# Patient Record
Sex: Male | Born: 1955 | Race: White | Hispanic: No | Smoking: Never smoker
Health system: Southern US, Community
[De-identification: ages and names within clinical notes are randomized; demographics above are authoritative.]

## PROBLEM LIST (undated history)

## (undated) DIAGNOSIS — S3992XA Unspecified injury of lower back, initial encounter: Secondary | ICD-10-CM

## (undated) DIAGNOSIS — N200 Calculus of kidney: Secondary | ICD-10-CM

## (undated) DIAGNOSIS — K5792 Diverticulitis of intestine, part unspecified, without perforation or abscess without bleeding: Secondary | ICD-10-CM

## (undated) DIAGNOSIS — I10 Essential (primary) hypertension: Secondary | ICD-10-CM

## (undated) HISTORY — DX: Calculus of kidney: N20.0

## (undated) HISTORY — DX: Unspecified injury of lower back, initial encounter: S39.92XA

---

## 1985-05-26 HISTORY — PX: WRIST SURGERY: SHX841

## 2015-09-10 ENCOUNTER — Emergency Department: Payer: BLUE CROSS/BLUE SHIELD

## 2015-09-10 ENCOUNTER — Encounter: Payer: Self-pay | Admitting: Emergency Medicine

## 2015-09-10 ENCOUNTER — Emergency Department
Admission: EM | Admit: 2015-09-10 | Discharge: 2015-09-10 | Disposition: A | Discharge status: Home / Self Care | Payer: BLUE CROSS/BLUE SHIELD | Attending: Student | Admitting: Student

## 2015-09-10 DIAGNOSIS — R109 Unspecified abdominal pain: Secondary | ICD-10-CM | POA: Diagnosis present

## 2015-09-10 DIAGNOSIS — N201 Calculus of ureter: Secondary | ICD-10-CM | POA: Diagnosis not present

## 2015-09-10 DIAGNOSIS — I10 Essential (primary) hypertension: Secondary | ICD-10-CM | POA: Diagnosis not present

## 2015-09-10 HISTORY — DX: Diverticulitis of intestine, part unspecified, without perforation or abscess without bleeding: K57.92

## 2015-09-10 HISTORY — DX: Essential (primary) hypertension: I10

## 2015-09-10 LAB — CBC WITH DIFFERENTIAL/PLATELET
BASOS ABS: 0 10*3/uL (ref 0–0.1)
Basophils Relative: 0 %
EOS PCT: 0 %
Eosinophils Absolute: 0 10*3/uL (ref 0–0.7)
HCT: 42.8 % (ref 40.0–52.0)
Hemoglobin: 15 g/dL (ref 13.0–18.0)
LYMPHS PCT: 9 %
Lymphs Abs: 1 10*3/uL (ref 1.0–3.6)
MCH: 32.1 pg (ref 26.0–34.0)
MCHC: 35 g/dL (ref 32.0–36.0)
MCV: 91.6 fL (ref 80.0–100.0)
Monocytes Absolute: 0.6 10*3/uL (ref 0.2–1.0)
Monocytes Relative: 5 %
NEUTROS ABS: 9.3 10*3/uL — AB (ref 1.4–6.5)
NEUTROS PCT: 86 %
PLATELETS: 214 10*3/uL (ref 150–440)
RBC: 4.68 MIL/uL (ref 4.40–5.90)
RDW: 13.2 % (ref 11.5–14.5)
WBC: 10.9 10*3/uL — AB (ref 3.8–10.6)

## 2015-09-10 LAB — URINALYSIS COMPLETE WITH MICROSCOPIC (ARMC ONLY)
BACTERIA UA: NONE SEEN
Bilirubin Urine: NEGATIVE
Glucose, UA: 50 mg/dL — AB
Nitrite: NEGATIVE
PROTEIN: NEGATIVE mg/dL
Specific Gravity, Urine: 1.02 (ref 1.005–1.030)
pH: 5 (ref 5.0–8.0)

## 2015-09-10 LAB — LIPASE, BLOOD: LIPASE: 25 U/L (ref 11–51)

## 2015-09-10 LAB — COMPREHENSIVE METABOLIC PANEL
ALT: 29 U/L (ref 17–63)
AST: 34 U/L (ref 15–41)
Albumin: 4.1 g/dL (ref 3.5–5.0)
Alkaline Phosphatase: 60 U/L (ref 38–126)
Anion gap: 6 (ref 5–15)
BUN: 24 mg/dL — AB (ref 6–20)
CHLORIDE: 108 mmol/L (ref 101–111)
CO2: 21 mmol/L — AB (ref 22–32)
CREATININE: 0.99 mg/dL (ref 0.61–1.24)
Calcium: 8.7 mg/dL — ABNORMAL LOW (ref 8.9–10.3)
GFR calc Af Amer: >60 mL/min (ref >60)
Glucose, Bld: 115 mg/dL — ABNORMAL HIGH (ref 65–99)
Potassium: 4.2 mmol/L (ref 3.5–5.1)
Sodium: 135 mmol/L (ref 135–145)
Total Bilirubin: 0.8 mg/dL (ref 0.3–1.2)
Total Protein: 7.5 g/dL (ref 6.5–8.1)

## 2015-09-10 MED ORDER — OXYCODONE HCL 5 MG PO TABS: 5.0000 mg | ORAL_TABLET | Freq: Four times a day (QID) | ORAL | Status: AC | PRN

## 2015-09-10 MED ORDER — SODIUM CHLORIDE 0.9 % IV BOLUS (SEPSIS)
1000.0000 mL | Freq: Once | INTRAVENOUS | Status: AC
  Administered 2015-09-10: 1000 mL via INTRAVENOUS

## 2015-09-10 MED ORDER — HYDROMORPHONE HCL 1 MG/ML IJ SOLN
1.0000 mg | Freq: Once | INTRAMUSCULAR | Status: AC
  Administered 2015-09-10: 1 mg via INTRAVENOUS
  Filled 2015-09-10: qty 1

## 2015-09-10 MED ORDER — KETOROLAC TROMETHAMINE 30 MG/ML IJ SOLN
15.0000 mg | Freq: Once | INTRAMUSCULAR | Status: AC
  Administered 2015-09-10: 15 mg via INTRAVENOUS
  Filled 2015-09-10: qty 1

## 2015-09-10 MED ORDER — ONDANSETRON HCL 4 MG/2ML IJ SOLN
INTRAMUSCULAR | Status: AC
  Filled 2015-09-10: qty 2

## 2015-09-10 MED ORDER — MORPHINE SULFATE (PF) 4 MG/ML IV SOLN
4.0000 mg | Freq: Once | INTRAVENOUS | Status: AC
  Administered 2015-09-10: 4 mg via INTRAVENOUS

## 2015-09-10 MED ORDER — ONDANSETRON HCL 4 MG/2ML IJ SOLN
4.0000 mg | Freq: Once | INTRAMUSCULAR | Status: AC
  Administered 2015-09-10: 4 mg via INTRAVENOUS

## 2015-09-10 MED ORDER — ONDANSETRON 4 MG PO TBDP: 4.0000 mg | ORAL_TABLET | Freq: Three times a day (TID) | ORAL | Status: AC | PRN

## 2015-09-10 MED ORDER — TAMSULOSIN HCL 0.4 MG PO CAPS: 0.4000 mg | ORAL_CAPSULE | Freq: Every day | ORAL | Status: AC

## 2015-09-10 MED ORDER — ONDANSETRON HCL 4 MG/2ML IJ SOLN
4.0000 mg | Freq: Once | INTRAMUSCULAR | Status: AC
  Administered 2015-09-10: 4 mg via INTRAVENOUS
  Filled 2015-09-10: qty 2

## 2015-09-10 MED ORDER — MORPHINE SULFATE (PF) 4 MG/ML IV SOLN
INTRAVENOUS | Status: AC
  Filled 2015-09-10: qty 1

## 2015-09-10 NOTE — ED Notes (Signed)
Patient transported to CT 

## 2015-09-10 NOTE — ED Notes (Deleted)
Pt with left flank pain 10/10. Denies changes with urination. Possibly had previous stone. Does have chronic back pain.

## 2015-09-10 NOTE — ED Notes (Signed)
Pt with left flank pain that started today. Possible prior stone. Denies changes or pain with urination. Does have chronic back pain.

## 2015-09-10 NOTE — ED Provider Notes (Signed)
[facility] Emergency Department Provider Note  ____________________________________________  Time seen: Approximately 10:02 AM  I have reviewed the triage vital signs and the nursing notes.   HISTORY  Chief Complaint Flank Pain    HPI Jonathan Lynch is a 60 y.o. male history of hypertension and diverticulitis presents for evaluation of several hours of sudden onset left flank pain, constant since onset, waxing and waning in nature, currently severe, associated with nausea. No diarrhea, no fevers or chills. No chest pain or difficulty breathing. Feels similar to when he had a kidney stone in the remote past. Denies dysuria or hematuria.   Past Medical History  Diagnosis Date  . Hypertension   . Diverticulitis     There are no active problems to display for this patient.   Past Surgical History  Procedure Laterality Date  . Back surgery      No current outpatient prescriptions on file.  Allergies Review of patient's allergies indicates no known allergies.  History reviewed. No pertinent family history.  Social History Social History  Substance Use Topics  . Smoking status: Never Smoker   . Smokeless tobacco: None  . Alcohol Use: No    Review of Systems Constitutional: No fever/chills Eyes: No visual changes. ENT: No sore throat. Cardiovascular: Denies chest pain. Respiratory: Denies shortness of breath. Gastrointestinal: No abdominal pain.  + nausea, no vomiting.  No diarrhea.  No constipation. Genitourinary: Negative for dysuria. Musculoskeletal: Positive for flank pain. Skin: Negative for rash. Neurological: Negative for headaches, focal weakness or numbness.  10-point ROS otherwise negative.  ____________________________________________   PHYSICAL EXAM:  VITAL SIGNS: ED Triage Vitals  Enc Vitals Group     BP 09/10/15 0842 146/93 mmHg     Pulse Rate 09/10/15 0842 63     Resp 09/10/15 0842 18     Temp 09/10/15  0842 97.8 F (36.6 C)     Temp Source 09/10/15 0842 Oral     SpO2 09/10/15 0842 99 %     Weight 09/10/15 0842 225 lb (102.059 kg)     Height 09/10/15 0842 5\' 11"  (1.803 m)     Head Cir --      Peak Flow --      Pain Score 09/10/15 0842 4     Pain Loc --      Pain Edu? --      Excl. in GC? --     Constitutional: Alert and oriented. In distress secondary to pain. Eyes: Conjunctivae are normal. PERRL. EOMI. Head: Atraumatic. Nose: No congestion/rhinnorhea. Mouth/Throat: Mucous membranes are moist.  Oropharynx non-erythematous. Neck: No stridor.  No cervical spine tenderness to palpation. Cardiovascular: Normal rate, regular rhythm. Grossly normal heart sounds.  Good peripheral circulation. Respiratory: Normal respiratory effort.  No retractions. Lungs CTAB. Gastrointestinal: Soft and nontender. No distention.  No CVA tenderness. Genitourinary: deferred Musculoskeletal: No lower extremity tenderness nor edema.  No joint effusions. Neurologic:  Normal speech and language. No gross focal neurologic deficits are appreciated. No gait instability. Skin:  Skin is warm, dry and intact. No rash noted. Psychiatric: Mood and affect are normal. Speech and behavior are normal.  ____________________________________________   LABS (all labs ordered are listed, but only abnormal results are displayed)  Labs Reviewed  URINALYSIS COMPLETEWITH MICROSCOPIC ([facility] ONLY) - Abnormal; Notable for the following:    Color, Urine YELLOW (*)    APPearance CLEAR (*)    Glucose, UA 50 (*)    Ketones, ur TRACE (*)    Hgb  urine dipstick 3+ (*)    Leukocytes, UA TRACE (*)    Squamous Epithelial / LPF 0-5 (*)    All other components within normal limits  CBC WITH DIFFERENTIAL/PLATELET - Abnormal; Notable for the following:    WBC 10.9 (*)    Neutro Abs 9.3 (*)    All other components within normal limits  COMPREHENSIVE METABOLIC PANEL - Abnormal; Notable for the following:    CO2 21 (*)    Glucose, Bld  115 (*)    BUN 24 (*)    Calcium 8.7 (*)    All other components within normal limits  LIPASE, BLOOD   ____________________________________________  EKG  none ____________________________________________  RADIOLOGY  CT abdomen and pelvis IMPRESSION: 1. 3 mm distal left ureteral calculus with mild left hydronephrosis and mild left perinephric stranding. ____________________________________________   PROCEDURES  Procedure(s) performed: None  Critical Care performed: No  ____________________________________________   INITIAL IMPRESSION / ASSESSMENT AND PLAN / ED COURSE  Pertinent labs & imaging results that were available during my care of the patient were reviewed by me and considered in my medical decision making (see chart for details).  Jonathan Lynch is a 60 y.o. male history of hypertension and diverticulitis presents for evaluation of several hours of sudden onset left flank pain. On exam, he is in distress secondary to pain. Suspect kidney stone, we'll treat his pain, obtain screening labs, urinalysis, likely CT of the abdomen and pelvis.  ----------------------------------------- 1:52 PM on 09/10/2015 ----------------------------------------- Patient with significant improvement of his pain at this time. He reports he feels much better and appears comfortable. He ambulates well. He is tolerating by mouth intake without vomiting. CT scan shows 3 mm distal left ureteral stone with mild associated hydronephrosis. Urinalysis with minimal white blood cells, nitrite negative, not consistent with infection. CBC with mild leukocytosis. CMP with normal creatinine. Normal lipase. Discussed return precautions, expectant management, need for close urology follow-up and he is comfortable with the discharge plan. DC home. ____________________________________________   FINAL CLINICAL IMPRESSION(S) / ED DIAGNOSES  Final diagnoses:  Ureterolithiasis  Flank pain, acute       Gayla Doss, MD 09/10/15 1353

## 2015-09-24 ENCOUNTER — Ambulatory Visit (INDEPENDENT_AMBULATORY_CARE_PROVIDER_SITE_OTHER): Payer: BLUE CROSS/BLUE SHIELD | Admitting: Urology

## 2015-09-24 ENCOUNTER — Encounter: Payer: Self-pay | Admitting: Urology

## 2015-09-24 VITALS — BP 119/74 | HR 83 | Ht 71.0 in | Wt 227.5 lb

## 2015-09-24 DIAGNOSIS — R3129 Other microscopic hematuria: Secondary | ICD-10-CM

## 2015-09-24 DIAGNOSIS — N201 Calculus of ureter: Secondary | ICD-10-CM

## 2015-09-24 DIAGNOSIS — N132 Hydronephrosis with renal and ureteral calculous obstruction: Secondary | ICD-10-CM

## 2015-09-24 LAB — URINALYSIS, COMPLETE
Bilirubin, UA: NEGATIVE
GLUCOSE, UA: NEGATIVE
KETONES UA: NEGATIVE
Nitrite, UA: NEGATIVE
Protein, UA: NEGATIVE
RBC, UA: NEGATIVE
SPEC GRAV UA: 1.025 (ref 1.005–1.030)
Urobilinogen, Ur: 0.2 mg/dL (ref 0.2–1.0)
pH, UA: 5 (ref 5.0–7.5)

## 2015-09-24 LAB — MICROSCOPIC EXAMINATION
BACTERIA UA: NONE SEEN
Epithelial Cells (non renal): NONE SEEN /hpf (ref 0–10)

## 2015-09-24 NOTE — Progress Notes (Signed)
09/24/2015 3:52 PM   Jonathan Lynch 07-02-1955 [id number]  Referring provider: Delton Prairie, MD [address] [facility] PRIMARY CARE [place], [place] [address]  Chief Complaint  Patient presents with  . Nephrolithiasis    ER referral    HPI: Patient is a 60 year old Caucasian male who is referred to [place] by [facility] ED for nephrolithiasis.  Patient states that about 3 weeks ago he had the sudden onset of left-sided flank pain. Prior to this, the pain initiating he had a dull ache down the left lower quadrant for about a week.  He describes the pain was colicky in nature. It became so intense that he sought treatment in the emergency room.   He did not have fever or chills associated with the pain, but he did have nausea and dry heaves. He denied any gross hematuria.  He believes he may have passed a stone spontaneously approximately 15 years ago.  In the emergency room, CT renal stone study conducted in 09/10/2015 noted a 3 mm distal left ureteral calculus with mild left hydronephrosis and mild left perinephric stranding.  UA at that time noted to numerous to count RBCs per high-power field.  Serum creatinine of 0.99 mg/dL.    He was discharged with analgesics, antiemetics and tamsulosin.   He was instructed to follow up with urology.    Today, he is not experiencing symptoms. He has since felt like he has passed stone. His UA today was unremarkable.  PMH: Past Medical History  Diagnosis Date  . Hypertension   . Diverticulitis   . Kidney stones   . Back injuries     fractured 5 vertebrae no surgery    Surgical History: Past Surgical History  Procedure Laterality Date  . Wrist surgery  1987    Home Medications:    Medication List       This list is accurate as of: 09/24/15  3:52 PM.  Always use your most recent med list.               FIBERCON PO  Take 625 mg by mouth.     lisinopril 5 MG tablet  Commonly known as:  PRINIVIL,ZESTRIL  Take 5 mg by mouth.     ondansetron 4 MG disintegrating tablet  Commonly known as:  ZOFRAN ODT  Take 1 tablet (4 mg total) by mouth every 8 (eight) hours as needed for nausea or vomiting.     oxyCODONE 5 MG immediate release tablet  Commonly known as:  ROXICODONE  Take 1 tablet (5 mg total) by mouth every 6 (six) hours as needed for moderate pain. Do not drive while taking this medication.     tamsulosin 0.4 MG Caps capsule  Commonly known as:  FLOMAX  Take 1 capsule (0.4 mg total) by mouth daily.     V-R IBUPROFEN JR 100 MG tablet  Generic drug:  ibuprofen  Take 100 mg by mouth.     Vitamin D3 2000 units capsule  Take by mouth.        Allergies: No Known Allergies  Family History: Family History  Problem Relation Age of Onset  . Kidney disease Neg Hx   . Prostate cancer Neg Hx     Social History:  reports that he has never smoked. He does not have any smokeless tobacco history on file. He reports that he does not drink alcohol or use illicit drugs.  ROS: UROLOGY Frequent Urination?: No Hard to postpone urination?: No Burning/pain with  urination?: No Get up at night to urinate?: No Leakage of urine?: No Urine stream starts and stops?: No Trouble starting stream?: No Do you have to strain to urinate?: No Blood in urine?: No Urinary tract infection?: No Sexually transmitted disease?: No Injury to kidneys or bladder?: No Painful intercourse?: No Weak stream?: No Erection problems?: No Penile pain?: No  Gastrointestinal Nausea?: No Vomiting?: No Indigestion/heartburn?: No Diarrhea?: No Constipation?: No  Constitutional Fever: No Night sweats?: No Weight loss?: No Fatigue?: No  Skin Skin rash/lesions?: No Itching?: No  Eyes Blurred vision?: No Double vision?: No  Ears/Nose/Throat Sore throat?: No Sinus problems?: No  Hematologic/Lymphatic Swollen glands?: No Easy bruising?: No  Cardiovascular Leg swelling?: No Chest pain?: No  Respiratory Cough?:  No Shortness of breath?: No  Endocrine Excessive thirst?: No  Musculoskeletal Back pain?: No Joint pain?: No  Neurological Headaches?: No Dizziness?: No  Psychologic Depression?: No Anxiety?: No  Physical Exam: BP 119/74 mmHg  Pulse 83  Ht  (1.803 m)  Wt 227 lb 8 oz (103.193 kg)  BMI 31.74 kg/m2  Constitutional: Well nourished. Alert and oriented, No acute distress. HEENT: [place] AT, moist mucus membranes. Trachea midline, no masses. Cardiovascular: No clubbing, cyanosis, or edema. Respiratory: Normal respiratory effort, no increased work of breathing. GI: Abdomen is soft, non tender, non distended, no abdominal masses. Liver and spleen not palpable.  No hernias appreciated.  Stool sample for occult testing is not indicated.   GU: No CVA tenderness.  No bladder fullness or masses.  Patient with circumcised phallus.   Urethral meatus is patent.  No penile discharge. No penile lesions or rashes. Scrotum without lesions, cysts, rashes and/or edema.  Testicles are located scrotally bilaterally. No masses are appreciated in the testicles. Left and right epididymis are normal.  Left varicocele is noted.  Rectal: Deferred.   Skin: No rashes, bruises or suspicious lesions. Lymph: No cervical or inguinal adenopathy. Neurologic: Grossly intact, no focal deficits, moving all 4 extremities. Psychiatric: Normal mood and affect.  Laboratory Data: Lab Results  Component Value Date   WBC 10.9* 09/10/2015   HGB 15.0 09/10/2015   HCT 42.8 09/10/2015   MCV 91.6 09/10/2015   PLT 214 09/10/2015    Lab Results  Component Value Date   CREATININE 0.99 09/10/2015    Lab Results  Component Value Date   AST 34 09/10/2015   Lab Results  Component Value Date   ALT 29 09/10/2015    Urinalysis Results for orders placed or performed in visit on 09/24/15  CULTURE, URINE COMPREHENSIVE  Result Value Ref Range   Urine Culture, Comprehensive Final report    Result 1 Comment    Microscopic Examination  Result Value Ref Range   WBC, UA 11-30 (A) 0 -  5 /hpf   RBC, UA 0-2 0 -  2 /hpf   Epithelial Cells (non renal) None seen 0 - 10 /hpf   Mucus, UA Present (A) Not Estab.   Bacteria, UA None seen None seen/Few  Urinalysis, Complete  Result Value Ref Range   Specific Gravity, UA 1.025 1.005 - 1.030   pH, UA 5.0 5.0 - 7.5   Color, UA Yellow Yellow   Appearance Ur Clear Clear   Leukocytes, UA Trace (A) Negative   Protein, UA Negative Negative/Trace   Glucose, UA Negative Negative   Ketones, UA Negative Negative   RBC, UA Negative Negative   Bilirubin, UA Negative Negative   Urobilinogen, Ur 0.2 0.2 - 1.0 mg/dL  Nitrite, UA Negative Negative   Microscopic Examination See below:     Pertinent Imaging: CLINICAL DATA: Left flank pain started today.  EXAM: CT ABDOMEN AND PELVIS WITHOUT CONTRAST  TECHNIQUE: Multidetector CT imaging of the abdomen and pelvis was performed following the standard protocol without IV contrast.  COMPARISON: None.  FINDINGS: Lower chest: Lung bases are clear. Normal heart size.  Hepatobiliary: Normal ule liver. Mildly distended gallbladder without other focal abnormality.  Pancreas: Normal.  Spleen: Normal.  Adrenals/Urinary Tract: Normal adrenal glands. Normal right kidney. 3 mm distal left ureteral calculus with mild left hydronephrosis. Mild left perinephric stranding. Normal bladder.  Stomach/Bowel: No bowel wall thickening or bowel dilatation. No pneumatosis, pneumoperitoneum or portal venous gas. Diverticulosis without evidence of diverticulitis.  Vascular/Lymphatic: Normal caliber abdominal aorta with atherosclerosis. No lymphadenopathy. Scratched  Other: No fluid collection or hematoma.  Musculoskeletal: No acute osseous abnormality. No lytic or sclerotic osseous lesion. Mild degenerative disc disease of the lower thoracic spine.  IMPRESSION: 1. 3 mm distal left ureteral calculus with  mild left hydronephrosis and mild left perinephric stranding.   Electronically Signed  By: Elige KoHetal Patel  On: 09/10/2015 11:18   Assessment & Plan:    1. Left ureteral calculus:   Patient has since felt like he passed the stone. We have sent for analysis. This may be his second episode of nephrolithiasis. We will discuss his interest in obtaining a 24-hour urine study when he returns in 1 month for renal ultrasound report.   2. Hydronephrosis:   Patient was found to have left hydronephrosis due to an distal 3 mm ureteral stone.  A RUS will be obtained in one month to ensure the hydronephrosis has resolved.    3. Microscopic hematuria:   Patient had TNTC RBC's/hpf during his visit to the emergency room on 09/10/2015. Today's UA does not demonstrate acute microscopic hematuria. He will be returning in 1 month for renal ultrasound report and we will recheck a UA at that time to ensure the hematuria has completely resolved with the passage of the stone.  - Urinalysis, Complete - CULTURE, URINE COMPREHENSIVE   Return for RUS report.  These notes generated with voice recognition software. I apologize for typographical errors.  Michiel CowboySHANNON Winfield Caba, PA-C  [facility] [address], [address] [place], [place] [address] [phone]

## 2015-09-27 LAB — CULTURE, URINE COMPREHENSIVE

## 2015-09-29 DIAGNOSIS — R3129 Other microscopic hematuria: Secondary | ICD-10-CM | POA: Insufficient documentation

## 2015-09-29 DIAGNOSIS — N201 Calculus of ureter: Secondary | ICD-10-CM | POA: Insufficient documentation

## 2015-09-29 DIAGNOSIS — N132 Hydronephrosis with renal and ureteral calculous obstruction: Secondary | ICD-10-CM | POA: Insufficient documentation

## 2015-10-01 ENCOUNTER — Ambulatory Visit
Admission: RE | Admit: 2015-10-01 | Discharge: 2015-10-01 | Disposition: A | Discharge status: Home / Self Care | Payer: BLUE CROSS/BLUE SHIELD | Source: Ambulatory Visit | Attending: Urology | Admitting: Urology

## 2015-10-01 DIAGNOSIS — N281 Cyst of kidney, acquired: Secondary | ICD-10-CM | POA: Insufficient documentation

## 2015-10-01 DIAGNOSIS — N132 Hydronephrosis with renal and ureteral calculous obstruction: Secondary | ICD-10-CM | POA: Diagnosis not present

## 2015-10-09 ENCOUNTER — Ambulatory Visit (INDEPENDENT_AMBULATORY_CARE_PROVIDER_SITE_OTHER): Payer: BLUE CROSS/BLUE SHIELD | Admitting: Urology

## 2015-10-09 ENCOUNTER — Encounter: Payer: Self-pay | Admitting: Urology

## 2015-10-09 VITALS — BP 144/97 | HR 94 | Ht 71.0 in | Wt 231.6 lb

## 2015-10-09 DIAGNOSIS — N201 Calculus of ureter: Secondary | ICD-10-CM

## 2015-10-09 DIAGNOSIS — N132 Hydronephrosis with renal and ureteral calculous obstruction: Secondary | ICD-10-CM

## 2015-10-09 DIAGNOSIS — R3129 Other microscopic hematuria: Secondary | ICD-10-CM | POA: Diagnosis not present

## 2015-10-09 LAB — URINALYSIS, COMPLETE
BILIRUBIN UA: NEGATIVE
GLUCOSE, UA: NEGATIVE
KETONES UA: NEGATIVE
Nitrite, UA: NEGATIVE
PH UA: 5.5 (ref 5.0–7.5)
PROTEIN UA: NEGATIVE
Specific Gravity, UA: 1.025 (ref 1.005–1.030)
UUROB: 0.2 mg/dL (ref 0.2–1.0)

## 2015-10-09 LAB — MICROSCOPIC EXAMINATION
Epithelial Cells (non renal): NONE SEEN /hpf (ref 0–10)
RBC, UA: NONE SEEN /hpf (ref 0–?)

## 2015-10-09 NOTE — Progress Notes (Signed)
4:36 PM   Jonathan Lynch [birth year] [id number]  Referring provider: Delton Prairie, MD [address] [facility] PRIMARY CARE [place], [place] [address]  Chief Complaint  Patient presents with  . Results    RUS    HPI: Patient is a 60 year old Caucasian male who presents today to discuss his renal ultrasound results and to recheck a UA after passage of a left ureteral calculus.  Background history Patient was referred to [place] by [facility] ED for nephrolithiasis.  In the emergency room, CT renal stone study conducted in 09/10/2015 noted a 3 mm distal left ureteral calculus with mild left hydronephrosis and mild left perinephric stranding.  UA at that time noted to numerous to count RBCs per high-power field.  Serum creatinine of 0.99 mg/dL.  He was discharged with analgesics, antiemetics and tamsulosin.   He was instructed to follow up with urology.  When he presented to [place] on 2024-05-14he felt like he had passed the stone as he was not experiencing symptoms and his UA was negative.  Today, he is still not experiencing any flank pain, dysuria, gross hematuria or suprapubic pain. He has not had recent reverse, chills, nausea or vomiting.   Renal ultrasound completed on 10/01/2015 noted no residual hydronephrosis. Bilateral ureteral jets were seen in the bladder. A simple exophytic 5.9 cm lower right renal cyst. No suspicious renal masses. Normal bladder. I personally reviewed the films with the patient.   PMH: Past Medical History  Diagnosis Date  . Hypertension   . Diverticulitis   . Kidney stones   . Back injuries     fractured 5 vertebrae no surgery    Surgical History: Past Surgical History  Procedure Laterality Date  . Wrist surgery  1987    Home Medications:    Medication List       This list is accurate as of: 10/09/15  4:36 PM.  Always use your most recent med list.               FIBERCON PO  Take 625 mg by mouth.     lisinopril 5 MG tablet  Commonly known  as:  PRINIVIL,ZESTRIL  Take 5 mg by mouth.     ondansetron 4 MG disintegrating tablet  Commonly known as:  ZOFRAN ODT  Take 1 tablet (4 mg total) by mouth every 8 (eight) hours as needed for nausea or vomiting.     oxyCODONE 5 MG immediate release tablet  Commonly known as:  ROXICODONE  Take 1 tablet (5 mg total) by mouth every 6 (six) hours as needed for moderate pain. Do not drive while taking this medication.     tamsulosin 0.4 MG Caps capsule  Commonly known as:  FLOMAX  Take 1 capsule (0.4 mg total) by mouth daily.     V-R IBUPROFEN JR 100 MG tablet  Generic drug:  ibuprofen  Take 100 mg by mouth.     Vitamin D3 2000 units capsule  Take by mouth.        Allergies: No Known Allergies  Family History: Family History  Problem Relation Age of Onset  . Kidney disease Neg Hx   . Prostate cancer Neg Hx     Social History:  reports that he has never smoked. He does not have any smokeless tobacco history on file. He reports that he does not drink alcohol or use illicit drugs.  ROS: UROLOGY Frequent Urination?: No Hard to postpone urination?: No Burning/pain with urination?: No  Get up at night to urinate?: No Leakage of urine?: No Urine stream starts and stops?: No Trouble starting stream?: No Do you have to strain to urinate?: No Blood in urine?: No Urinary tract infection?: No Sexually transmitted disease?: No Injury to kidneys or bladder?: No Painful intercourse?: No Weak stream?: No Erection problems?: No Penile pain?: No  Gastrointestinal Nausea?: No Vomiting?: No Indigestion/heartburn?: No Diarrhea?: No Constipation?: No  Constitutional Fever: No Night sweats?: No Weight loss?: No Fatigue?: No  Skin Skin rash/lesions?: No Itching?: No  Eyes Blurred vision?: No Double vision?: No  Ears/Nose/Throat Sore throat?: No Sinus problems?: No  Hematologic/Lymphatic Swollen glands?: No Easy bruising?: No  Cardiovascular Leg swelling?:  No Chest pain?: No  Respiratory Cough?: No Shortness of breath?: No  Endocrine Excessive thirst?: No  Musculoskeletal Back pain?: No Joint pain?: No  Neurological Headaches?: No Dizziness?: No  Psychologic Depression?: No Anxiety?: No  Physical Exam: BP 144/97 mmHg  Pulse 94  Ht 5\' 11"  (1.803 m)  Wt 231 lb 9.6 oz (105.053 kg)  BMI 32.32 kg/m2  Constitutional: Well nourished. Alert and oriented, No acute distress. HEENT: [place] AT, moist mucus membranes. Trachea midline, no masses. Cardiovascular: No clubbing, cyanosis, or edema. Respiratory: Normal respiratory effort, no increased work of breathing. GI: Abdomen is soft, non tender, non distended, no abdominal masses.  Skin: No rashes, bruises or suspicious lesions. Lymph: No cervical or inguinal adenopathy. Neurologic: Grossly intact, no focal deficits, moving all 4 extremities. Psychiatric: Normal mood and affect.  Laboratory Data: Lab Results  Component Value Date   WBC 10.9* 09/10/2015   HGB 15.0 09/10/2015   HCT 42.8 09/10/2015   MCV 91.6 09/10/2015   PLT 214 09/10/2015    Lab Results  Component Value Date   CREATININE 0.99 09/10/2015    Lab Results  Component Value Date   AST 34 09/10/2015   Lab Results  Component Value Date   ALT 29 09/10/2015    Urinalysis Results for orders placed or performed in visit on 10/09/15  Microscopic Examination  Result Value Ref Range   WBC, UA 6-10 (A) 0 -  5 /hpf   RBC, UA None seen 0 -  2 /hpf   Epithelial Cells (non renal) None seen 0 - 10 /hpf   Mucus, UA Present (A) Not Estab.   Bacteria, UA Few (A) None seen/Few  Urinalysis, Complete  Result Value Ref Range   Specific Gravity, UA 1.025 1.005 - 1.030   pH, UA 5.5 5.0 - 7.5   Color, UA Yellow Yellow   Appearance Ur Clear Clear   Leukocytes, UA 2+ (A) Negative   Protein, UA Negative Negative/Trace   Glucose, UA Negative Negative   Ketones, UA Negative Negative   RBC, UA Trace (A) Negative    Bilirubin, UA Negative Negative   Urobilinogen, Ur 0.2 0.2 - 1.0 mg/dL   Nitrite, UA Negative Negative   Microscopic Examination See below:    Pertinent Imaging: CLINICAL DATA: Follow-up left hydronephrosis due to obstructing distal left ureteral stone on recent CT study.  EXAM: RENAL / URINARY TRACT ULTRASOUND COMPLETE  COMPARISON: 09/10/2015 CT abdomen/pelvis.  FINDINGS: Right Kidney:  Length: 15.6 cm. Normal right renal parenchymal echogenicity. No right hydronephrosis. Simple exophytic 5.9 x 5.0 x 5.2 cm renal cyst in the lower right kidney. No additional right renal mass is demonstrated.  Left Kidney:  Length: 12.3 cm. Normal left renal parenchymal echogenicity. No residual left hydronephrosis. No left renal mass is demonstrated.  Bladder:  Appears  normal for degree of bladder distention. Bilateral ureteral jets are demonstrated in the bladder lumen.  IMPRESSION: 1. No residual hydronephrosis. Bilateral ureteral jets are seen in the bladder. 2. Simple exophytic 5.9 cm lower right renal cyst. No suspicious renal masses. 3. Normal bladder.   Electronically Signed  By: Delbert PhenixJason A Poff M.D.  On: 10/01/2015 16:49  Assessment & Plan:    1. Left ureteral calculus:   No hydronephrosis was seen on renal ultrasound.  He is not interest in obtaining a 24-hour urine study at this time.    2. Hydronephrosis:   A RUS demonstrated the hydronephrosis has resolved.    3. Microscopic hematuria:   Patient had TNTC RBC's/hpf during his visit to the emergency room on 09/10/2015.  His UA at his previous visit and today's UA does not demonstrate acute microscopic hematuria.   - Urinalysis, Complete  Return if symptoms worsen or fail to improve.  These notes generated with voice recognition software. I apologize for typographical errors.  Michiel CowboySHANNON Paden Senger, PA-C  [facility] [address], [address] [place], [place] [address] [phone])  [phone]

## 2017-11-03 IMAGING — CT CT RENAL STONE PROTOCOL
3 of 4 series · 8 of 46 positions shown, 15 images · non-contrast
Comparison: None.

CLINICAL DATA: Left flank pain started today.

EXAM:
CT ABDOMEN AND PELVIS WITHOUT CONTRAST
TECHNIQUE: Multidetector CT imaging of the abdomen and pelvis was performed
following the standard protocol without IV contrast.

[Series 4: lung · axial · 0.81mm/px · z∈[-178,-108]mm · 4 of 24 slices shown, 9 images]
[im 5/24  soft-tissue]
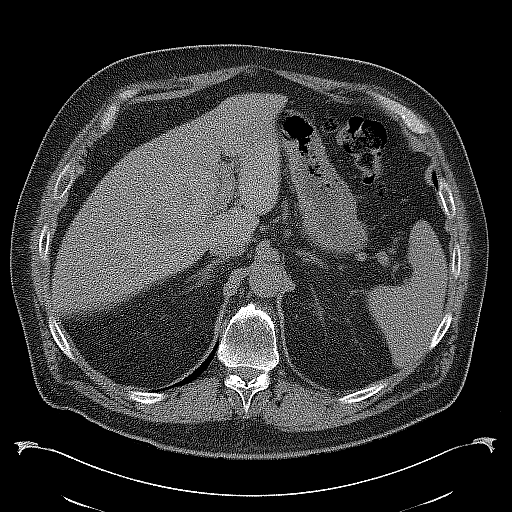
[im 5/24  lung]
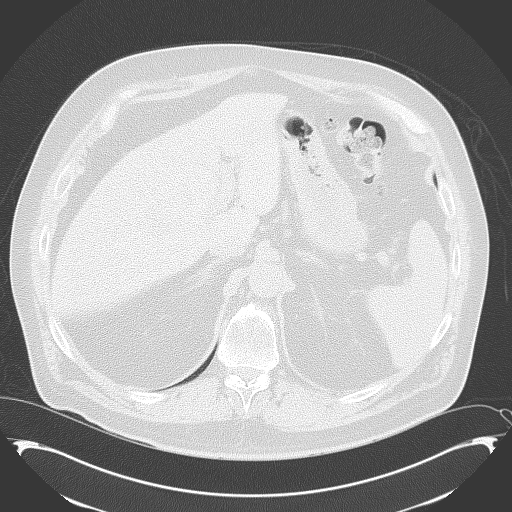
[im 5/24  bone]
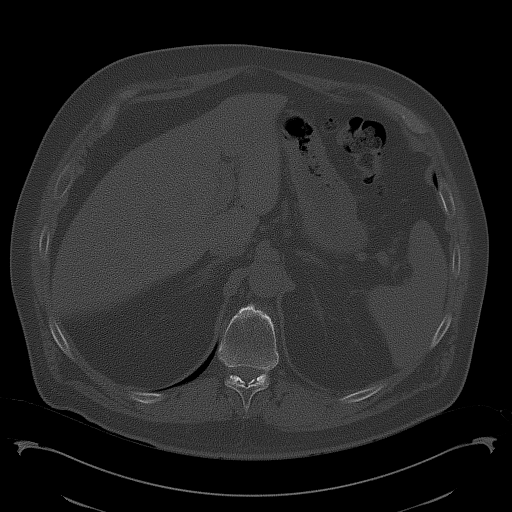
[im 10/24  soft-tissue]
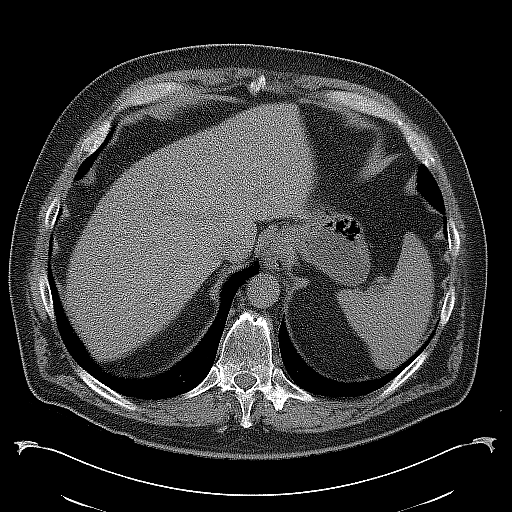
[im 10/24  lung]
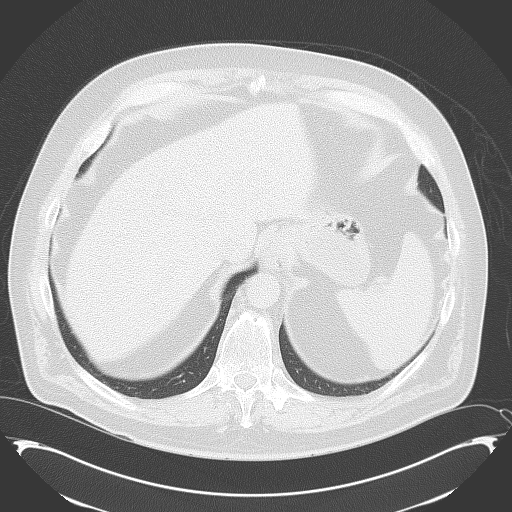
[im 14/24  soft-tissue]
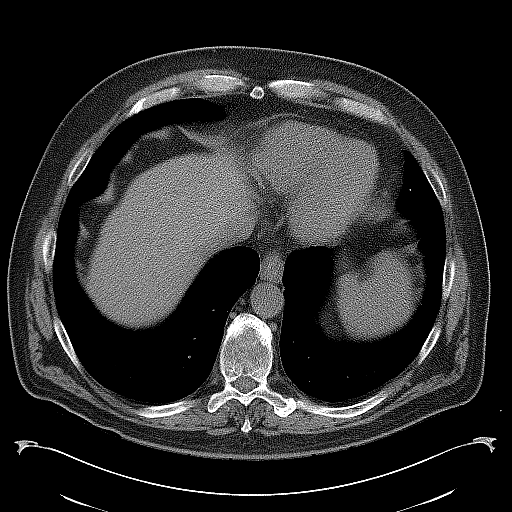
[im 14/24  lung]
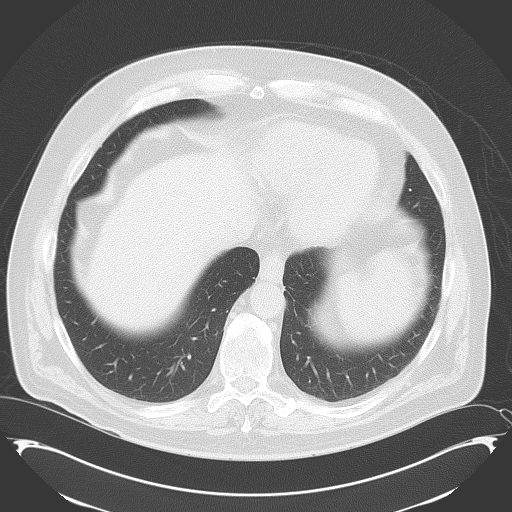
[im 19/24  soft-tissue]
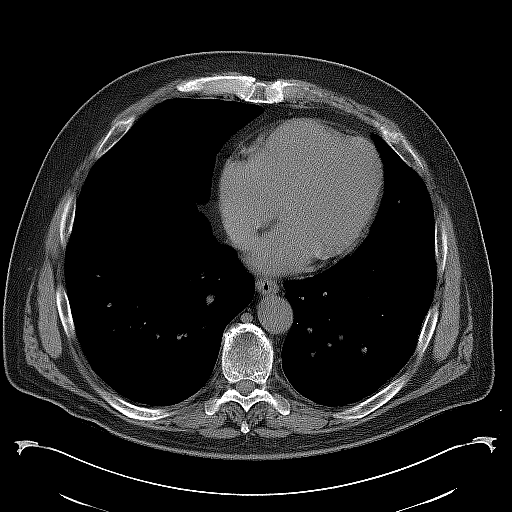
[im 19/24  lung]
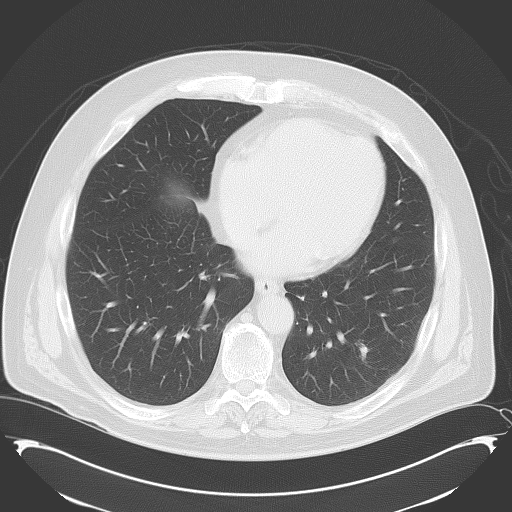

[Series 5: coronal · coronal · 0.76mm/px · 3 of 158 slices shown, 4 images]
[im 53/158  soft-tissue]
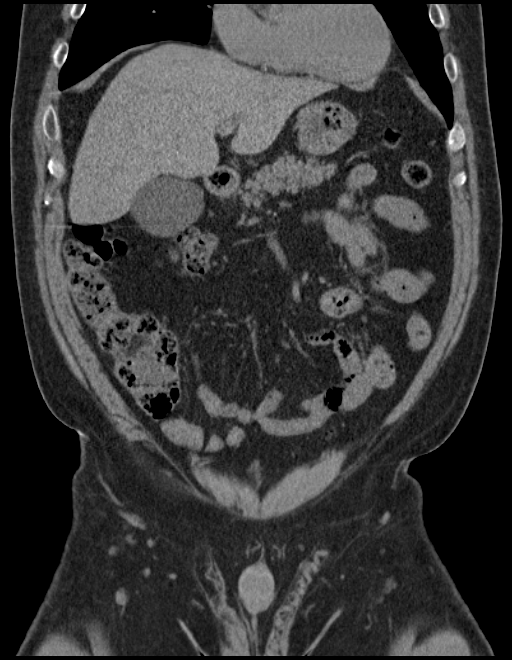
[im 70/158  soft-tissue]
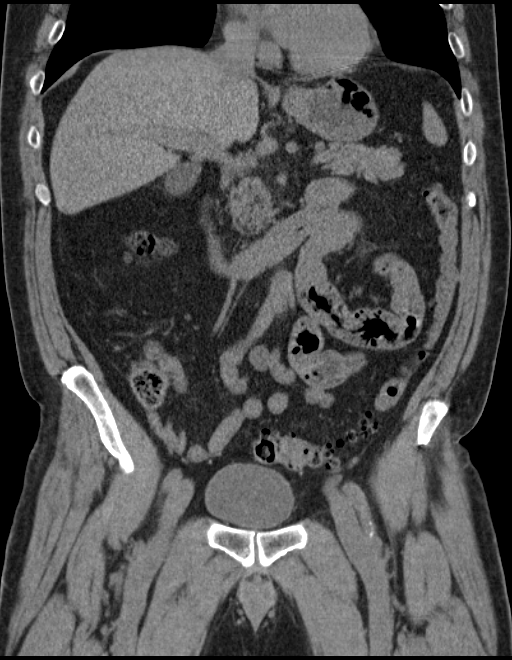
[im 70/158  bone]
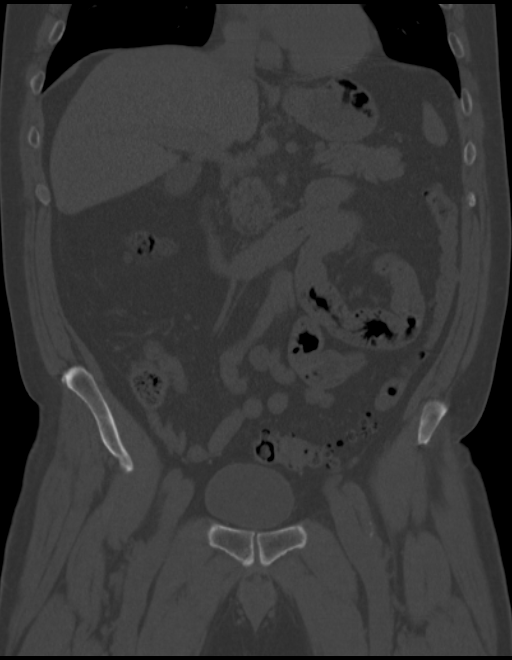
[im 88/158  soft-tissue]
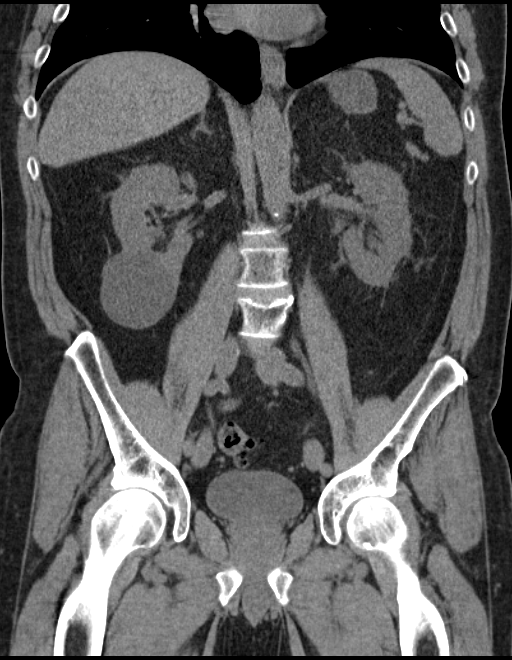

[Series 6: sagittal · sagittal · 0.65mm/px · 1 of 196 slices shown, 2 images]
[im 66/196  soft-tissue]
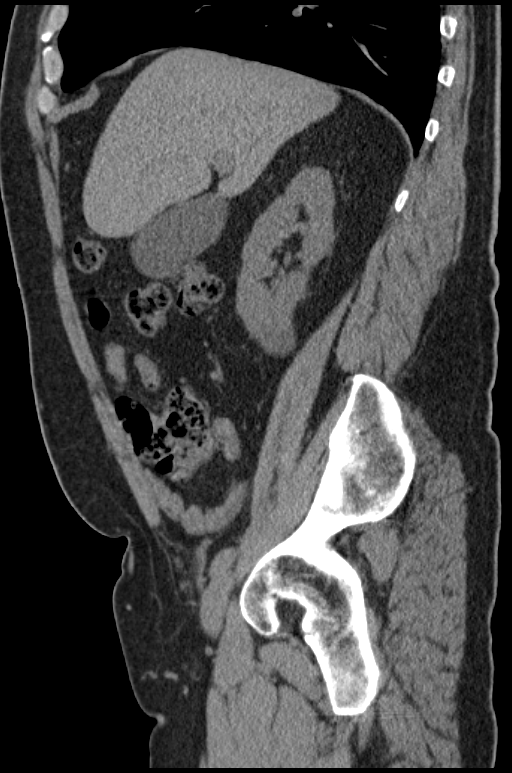
[im 66/196  bone]
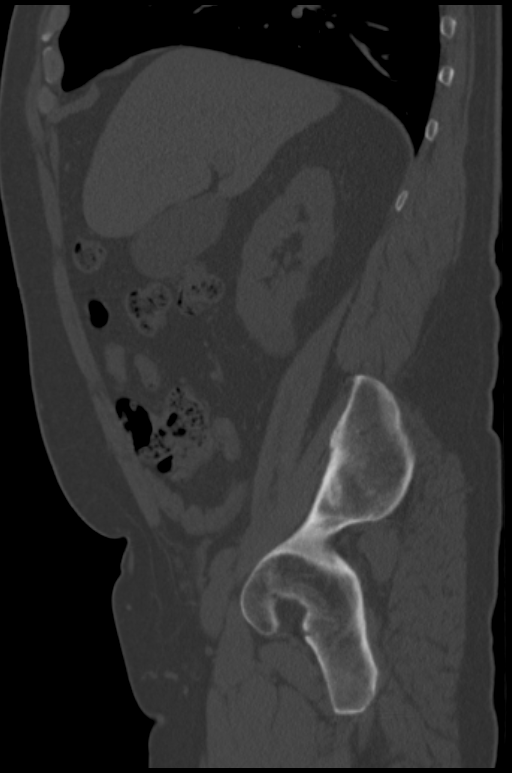

[8 of 46 positions shown; findings below may reference images not displayed]

FINDINGS: Lower chest:  Lung bases are clear.  Normal heart size.

Hepatobiliary: Normal ule liver. Mildly distended gallbladder
without other focal abnormality.

Pancreas: Normal.

Spleen: Normal.

Adrenals/Urinary Tract: Normal adrenal glands. Normal right kidney.
3 mm distal left ureteral calculus with mild left hydronephrosis.
Mild left perinephric stranding. Normal bladder.

Stomach/Bowel: No bowel wall thickening or bowel dilatation. No
pneumatosis, pneumoperitoneum or portal venous gas. Diverticulosis
without evidence of diverticulitis.

Vascular/Lymphatic: Normal caliber abdominal aorta with
atherosclerosis. No lymphadenopathy. Scratched

Other: No fluid collection or hematoma.

Musculoskeletal: No acute osseous abnormality. No lytic or sclerotic
osseous lesion. Mild degenerative disc disease of the lower thoracic
spine.
IMPRESSION: 1. 3 mm distal left ureteral calculus with mild left hydronephrosis
and mild left perinephric stranding.

## 2019-12-17 ENCOUNTER — Ambulatory Visit: Payer: BC Managed Care – PPO | Attending: Internal Medicine

## 2019-12-17 ENCOUNTER — Other Ambulatory Visit: Payer: Self-pay

## 2019-12-17 DIAGNOSIS — Z23 Encounter for immunization: Secondary | ICD-10-CM

## 2019-12-17 NOTE — Progress Notes (Signed)
   [facility]  Name:  FRANKLIN BAUMBACH    MRN: [id number] DOB: 1955-09-05  12/17/2019  Mr. Lapage was observed post Covid-19 immunization for 15 minutes without incident. He was provided with Vaccine Information Sheet and instruction to access the V-Safe system.   Mr. Oaxaca was instructed to call [phone] with any severe reactions post vaccine: Marland Kitchen Difficulty breathing  . Swelling of face and throat  . A fast heartbeat  . A bad rash all over body  . Dizziness and weakness   Immunizations Administered    Name Date Dose VIS Date Route   Pfizer COVID-19 Vaccine 12/17/2019 10:13 AM 0.3 mL 07/20/2018 Intramuscular   Manufacturer: [employer], [employer]   Lot: [id number]   NDC: [address]-4

## 2020-01-12 ENCOUNTER — Ambulatory Visit: Payer: BC Managed Care – PPO
